# Patient Record
Sex: Male | Born: 1970 | Race: Black or African American | Hispanic: No | Marital: Single | Smoking: Former smoker
Health system: Southern US, Community
[De-identification: ages and names within clinical notes are randomized; demographics above are authoritative.]

## PROBLEM LIST (undated history)

## (undated) DIAGNOSIS — G459 Transient cerebral ischemic attack, unspecified: Secondary | ICD-10-CM

---

## 2021-04-30 ENCOUNTER — Observation Stay (HOSPITAL_COMMUNITY)
Admission: EM | Admit: 2021-04-30 | Discharge: 2021-04-30 | Disposition: A | Discharge status: Home / Self Care | Payer: Commercial Managed Care - PPO | Attending: Internal Medicine | Admitting: Internal Medicine

## 2021-04-30 ENCOUNTER — Emergency Department (HOSPITAL_COMMUNITY): Payer: Commercial Managed Care - PPO

## 2021-04-30 ENCOUNTER — Observation Stay (HOSPITAL_BASED_OUTPATIENT_CLINIC_OR_DEPARTMENT_OTHER): Payer: Commercial Managed Care - PPO

## 2021-04-30 ENCOUNTER — Observation Stay (HOSPITAL_COMMUNITY): Payer: Commercial Managed Care - PPO

## 2021-04-30 ENCOUNTER — Other Ambulatory Visit: Payer: Self-pay

## 2021-04-30 ENCOUNTER — Encounter (HOSPITAL_COMMUNITY): Payer: Self-pay

## 2021-04-30 DIAGNOSIS — Z87891 Personal history of nicotine dependence: Secondary | ICD-10-CM | POA: Diagnosis not present

## 2021-04-30 DIAGNOSIS — G459 Transient cerebral ischemic attack, unspecified: Secondary | ICD-10-CM

## 2021-04-30 DIAGNOSIS — Z20822 Contact with and (suspected) exposure to covid-19: Secondary | ICD-10-CM | POA: Insufficient documentation

## 2021-04-30 DIAGNOSIS — R42 Dizziness and giddiness: Secondary | ICD-10-CM | POA: Insufficient documentation

## 2021-04-30 DIAGNOSIS — Y900 Blood alcohol level of less than 20 mg/100 ml: Secondary | ICD-10-CM | POA: Insufficient documentation

## 2021-04-30 DIAGNOSIS — R200 Anesthesia of skin: Secondary | ICD-10-CM | POA: Diagnosis present

## 2021-04-30 LAB — DIFFERENTIAL
Abs Immature Granulocytes: 0.01 10*3/uL (ref 0.00–0.07)
Basophils Absolute: 0 10*3/uL (ref 0.0–0.1)
Basophils Relative: 0 %
Eosinophils Absolute: 0.1 10*3/uL (ref 0.0–0.5)
Eosinophils Relative: 2 %
Immature Granulocytes: 0 %
Lymphocytes Relative: 56 %
Lymphs Abs: 3.2 10*3/uL (ref 0.7–4.0)
Monocytes Absolute: 0.4 10*3/uL (ref 0.1–1.0)
Monocytes Relative: 6 %
Neutro Abs: 2.1 10*3/uL (ref 1.7–7.7)
Neutrophils Relative %: 36 %

## 2021-04-30 LAB — CBC
HCT: 42.7 % (ref 39.0–52.0)
Hemoglobin: 14.4 g/dL (ref 13.0–17.0)
MCH: 32.3 pg (ref 26.0–34.0)
MCHC: 33.7 g/dL (ref 30.0–36.0)
MCV: 95.7 fL (ref 80.0–100.0)
Platelets: 214 10*3/uL (ref 150–400)
RBC: 4.46 MIL/uL (ref 4.22–5.81)
RDW: 14 % (ref 11.5–15.5)
WBC: 5.8 10*3/uL (ref 4.0–10.5)
nRBC: 0 % (ref 0.0–0.2)

## 2021-04-30 LAB — LIPID PANEL
Cholesterol: 311 mg/dL — ABNORMAL HIGH (ref 0–200)
HDL: 74 mg/dL (ref >40)
LDL Cholesterol: 204 mg/dL — ABNORMAL HIGH (ref 0–99)
Total CHOL/HDL Ratio: 4.2 RATIO
Triglycerides: 164 mg/dL — ABNORMAL HIGH (ref <150)
VLDL: 33 mg/dL (ref 0–40)

## 2021-04-30 LAB — ECHOCARDIOGRAM COMPLETE
Area-P 1/2: 3.72 cm2
Height: 71 in
S' Lateral: 2.8 cm
Weight: 3840 oz

## 2021-04-30 LAB — URINALYSIS, ROUTINE W REFLEX MICROSCOPIC
Bilirubin Urine: NEGATIVE
Glucose, UA: NEGATIVE mg/dL
Hgb urine dipstick: NEGATIVE
Ketones, ur: NEGATIVE mg/dL
Leukocytes,Ua: NEGATIVE
Nitrite: NEGATIVE
Protein, ur: NEGATIVE mg/dL
Specific Gravity, Urine: 1.012 (ref 1.005–1.030)
pH: 7 (ref 5.0–8.0)

## 2021-04-30 LAB — CBG MONITORING, ED: Glucose-Capillary: 100 mg/dL — ABNORMAL HIGH (ref 70–99)

## 2021-04-30 LAB — COMPREHENSIVE METABOLIC PANEL
ALT: 58 U/L — ABNORMAL HIGH (ref 0–44)
AST: 90 U/L — ABNORMAL HIGH (ref 15–41)
Albumin: 4.9 g/dL (ref 3.5–5.0)
Alkaline Phosphatase: 63 U/L (ref 38–126)
Anion gap: 12 (ref 5–15)
BUN: 14 mg/dL (ref 6–20)
CO2: 28 mmol/L (ref 22–32)
Calcium: 9.7 mg/dL (ref 8.9–10.3)
Chloride: 101 mmol/L (ref 98–111)
Creatinine, Ser: 1.51 mg/dL — ABNORMAL HIGH (ref 0.61–1.24)
GFR, Estimated: 57 mL/min — ABNORMAL LOW (ref >60)
Glucose, Bld: 105 mg/dL — ABNORMAL HIGH (ref 70–99)
Potassium: 3.9 mmol/L (ref 3.5–5.1)
Sodium: 141 mmol/L (ref 135–145)
Total Bilirubin: 0.7 mg/dL (ref 0.3–1.2)
Total Protein: 8.1 g/dL (ref 6.5–8.1)

## 2021-04-30 LAB — APTT: aPTT: 31 seconds (ref 24–36)

## 2021-04-30 LAB — HEMOGLOBIN A1C
Hgb A1c MFr Bld: 5.4 % (ref 4.8–5.6)
Mean Plasma Glucose: 108.28 mg/dL

## 2021-04-30 LAB — RESP PANEL BY RT-PCR (FLU A&B, COVID) ARPGX2
Influenza A by PCR: NEGATIVE
Influenza B by PCR: NEGATIVE
SARS Coronavirus 2 by RT PCR: NEGATIVE

## 2021-04-30 LAB — RAPID URINE DRUG SCREEN, HOSP PERFORMED
Amphetamines: NOT DETECTED
Barbiturates: NOT DETECTED
Benzodiazepines: NOT DETECTED
Cocaine: NOT DETECTED
Opiates: NOT DETECTED
Tetrahydrocannabinol: NOT DETECTED

## 2021-04-30 LAB — PROTIME-INR
INR: 0.9 (ref 0.8–1.2)
Prothrombin Time: 12.4 seconds (ref 11.4–15.2)

## 2021-04-30 LAB — ETHANOL: Alcohol, Ethyl (B): <10 mg/dL (ref <10)

## 2021-04-30 MED ORDER — ASPIRIN 325 MG PO TABS
325.0000 mg | ORAL_TABLET | Freq: Every day | ORAL | Status: DC
  Administered 2021-04-30: 325 mg via ORAL
  Filled 2021-04-30: qty 1

## 2021-04-30 MED ORDER — IOHEXOL 350 MG/ML SOLN
100.0000 mL | Freq: Once | INTRAVENOUS | Status: AC | PRN
  Administered 2021-04-30: 100 mL via INTRAVENOUS

## 2021-04-30 MED ORDER — ATORVASTATIN CALCIUM 40 MG PO TABS: 40.0000 mg | ORAL_TABLET | Freq: Every day | ORAL | 3 refills | Status: DC

## 2021-04-30 MED ORDER — STROKE: EARLY STAGES OF RECOVERY BOOK
Freq: Once | Status: DC
  Filled 2021-04-30: qty 1

## 2021-04-30 MED ORDER — ASPIRIN EC 81 MG PO TBEC: 81.0000 mg | DELAYED_RELEASE_TABLET | Freq: Every day | ORAL | 11 refills | Status: AC

## 2021-04-30 MED ORDER — ASPIRIN 300 MG RE SUPP: 300.0000 mg | Freq: Every day | RECTAL | Status: DC

## 2021-04-30 NOTE — ED Triage Notes (Signed)
Pt reports nasal congestion, generalized weakness and dizziness for around a week. 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 Pt sts sudden left sided numbness and like he is staggering since waking up at 0330.

## 2021-04-30 NOTE — ED Notes (Signed)
Speech therapy screened pt. Pt now eating lunch.

## 2021-04-30 NOTE — ED Notes (Signed)
OT at the bedside

## 2021-04-30 NOTE — ED Notes (Signed)
Urinal at bedside.  

## 2021-04-30 NOTE — H&P (Signed)
History and Physical    Andrew Hays XNA:[id number] DOB: 09/09/1971 DOA: 04/30/2021  PCP: Pcp, No  Patient coming from: Home.  I have personally briefly reviewed patient's old medical records available.   Chief Complaint: Left arm and left leg numbness.  HPI: Andrew Hays is a 50 y.o. male with no significant medical history woke up at 3 AM with numb left arm and left leg on the posterior aspect.  Went to bed without symptoms at about 11 PM at night.  He thinks he slept wrong.  The symptoms have scared him so he drove himself to the hospital.  Patient was suffering from allergies and upper respiratory symptoms for last week.  He thinks he does usually get allergy symptoms after he suffered from COVID-19 last year. He denies any headache, nausea, vomiting.  Denies any visual changes.  Denies any auditory changes.  No facial droop.  No slurred speech.  Was able to drive to the ER.  ED Course: Hemodynamically stable.  No neurological deficits.  Stat head CT scan with a stroke protocol was normal.  Subsequent MRI is without any evidence of a stroke. Seen by teleneurology who recommended stroke work-up. On my evaluation, patient is asymptomatic and hungry.  Review of Systems: all systems are reviewed and pertinent positive as per HPI otherwise rest are negative.    History reviewed. No pertinent past medical history.  History reviewed. No pertinent surgical history.  Social history   reports that he quit smoking about 16 months ago. He has never used smokeless tobacco. He reports previous alcohol use. He reports previous drug use.  Allergies  Allergen Reactions  . Other Nausea And Vomiting    All seafood except tuna causes nausea and vomiting    History reviewed. No pertinent family history. Denies any family history of stroke or cardiac disease.  Prior to Admission medications   Not on File    Physical Exam: Vitals:   04/30/21 0615 04/30/21 0705 04/30/21 0738 04/30/21  1007  BP: (!) 135/93 (!) 132/94 131/80 (!) 136/97  Pulse: (!) 54 (!) 55 62 (!) 57  Resp: 18 18 16 17   Temp:   97.7 F (36.5 C) 98.3 F (36.8 C)  TempSrc:   Oral Oral  SpO2: 97% 98% 98% 97%  Weight:      Height:        Constitutional: NAD, calm, comfortable Vitals:   04/30/21 0615 04/30/21 0705 04/30/21 0738 04/30/21 1007  BP: (!) 135/93 (!) 132/94 131/80 (!) 136/97  Pulse: (!) 54 (!) 55 62 (!) 57  Resp: 18 18 16 17   Temp:   97.7 F (36.5 C) 98.3 F (36.8 C)  TempSrc:   Oral Oral  SpO2: 97% 98% 98% 97%  Weight:      Height:       Eyes: PERRL, lids and conjunctivae normal ENMT: Mucous membranes are moist. Posterior pharynx clear of any exudate or lesions.Normal dentition.  Neck: normal, supple, no masses, no thyromegaly Respiratory: clear to auscultation bilaterally, no wheezing, no crackles. Normal respiratory effort. No accessory muscle use.  Cardiovascular: Regular rate and rhythm, no murmurs / rubs / gallops. No extremity edema. 2+ pedal pulses. No carotid bruits.  Abdomen: no tenderness, no masses palpated. No hepatosplenomegaly. Bowel sounds positive.  Musculoskeletal: no clubbing / cyanosis. No joint deformity upper and lower extremities. Good ROM, no contractures. Normal muscle tone.  Skin: no rashes, lesions, ulcers. No induration Neurologic: CN 2-12 grossly intact. Sensation intact, DTR normal. Strength 5/5 in all  4.  Psychiatric: Normal judgment and insight. Alert and oriented x 3. Normal mood.     Labs on Admission: I have personally reviewed following labs and imaging studies  CBC: Recent Labs  Lab 04/30/21 0530  WBC 5.8  NEUTROABS 2.1  HGB 14.4  HCT 42.7  MCV 95.7  PLT 214   Basic Metabolic Panel: Recent Labs  Lab 04/30/21 0530  NA 141  K 3.9  CL 101  CO2 28  GLUCOSE 105*  BUN 14  CREATININE 1.51*  CALCIUM 9.7   GFR: Estimated Creatinine Clearance: 75.9 mL/min (A) (by C-G formula based on SCr of 1.51 mg/dL (H)). Liver Function  Tests: Recent Labs  Lab 04/30/21 0530  AST 90*  ALT 58*  ALKPHOS 63  BILITOT 0.7  PROT 8.1  ALBUMIN 4.9   No results for input(s): LIPASE, AMYLASE in the last 168 hours. No results for input(s): AMMONIA in the last 168 hours. Coagulation Profile: Recent Labs  Lab 04/30/21 0530  INR 0.9   Cardiac Enzymes: No results for input(s): CKTOTAL, CKMB, CKMBINDEX, TROPONINI in the last 168 hours. BNP (last 3 results) No results for input(s): PROBNP in the last 8760 hours. HbA1C: No results for input(s): HGBA1C in the last 72 hours. CBG: Recent Labs  Lab 04/30/21 0541  GLUCAP 100*   Lipid Profile: Recent Labs    04/30/21 0918  CHOL 311*  HDL 74  LDLCALC 204*  TRIG 164*  CHOLHDL 4.2   Thyroid Function Tests: No results for input(s): TSH, T4TOTAL, FREET4, T3FREE, THYROIDAB in the last 72 hours. Anemia Panel: No results for input(s): VITAMINB12, FOLATE, FERRITIN, TIBC, IRON, RETICCTPCT in the last 72 hours. Urine analysis:    Component Value Date/Time   COLORURINE YELLOW 04/30/2021 0742   APPEARANCEUR CLEAR 04/30/2021 0742   LABSPEC 1.012 04/30/2021 0742   PHURINE 7.0 04/30/2021 0742   GLUCOSEU NEGATIVE 04/30/2021 0742   HGBUR NEGATIVE 04/30/2021 0742   BILIRUBINUR NEGATIVE 04/30/2021 0742   KETONESUR NEGATIVE 04/30/2021 0742   PROTEINUR NEGATIVE 04/30/2021 0742   NITRITE NEGATIVE 04/30/2021 0742   LEUKOCYTESUR NEGATIVE 04/30/2021 0742    Radiological Exams on Admission: CT ANGIO HEAD W OR WO CONTRAST  Result Date: 04/30/2021 CLINICAL DATA:  Dizziness with left-sided numbness. EXAM: CT ANGIOGRAPHY HEAD AND NECK TECHNIQUE: Multidetector CT imaging of the head and neck was performed using the standard protocol during bolus administration of intravenous contrast. Multiplanar CT image reconstructions and MIPs were obtained to evaluate the vascular anatomy. Carotid stenosis measurements (when applicable) are obtained utilizing NASCET criteria, using the distal internal  carotid diameter as the denominator. CONTRAST:  OMNIPAQUE IOHEXOL 350 MG/ML SOLN COMPARISON:  MRI in CT same day FINDINGS: CTA NECK FINDINGS Aortic arch: Normal Right carotid system: Normal. No bifurcation disease. No dissection. Left carotid system: Normal. No bifurcation disease. No dissection. Vertebral arteries: Normal Skeleton: Ordinary mild midcervical spondylosis. Other neck: Normal Upper chest: Normal Review of the MIP images confirms the above findings CTA HEAD FINDINGS Anterior circulation: Both internal carotid arteries are patent through the skull base and siphon regions. The anterior and middle cerebral vessels are normal. No large or medium vessel occlusion, stenosis, aneurysm or vascular malformation. Posterior circulation: Both vertebral arteries widely patent to the basilar. No basilar stenosis. Posterior circulation branch vessels are normal. Venous sinuses: Patent and normal. Anatomic variants: None significant. Review of the MIP images confirms the above findings IMPRESSION: Normal examination. No evidence of atherosclerotic vascular disease. No large or medium vessel occlusion. No dissection. Electronically  Signed   By: Paulina FusiMark  Shogry M.D.   On: 04/30/2021 08:41   CT ANGIO NECK W OR WO CONTRAST  Result Date: 04/30/2021 CLINICAL DATA:  Dizziness with left-sided numbness. EXAM: CT ANGIOGRAPHY HEAD AND NECK TECHNIQUE: Multidetector CT imaging of the head and neck was performed using the standard protocol during bolus administration of intravenous contrast. Multiplanar CT image reconstructions and MIPs were obtained to evaluate the vascular anatomy. Carotid stenosis measurements (when applicable) are obtained utilizing NASCET criteria, using the distal internal carotid diameter as the denominator. CONTRAST:  100mL OMNIPAQUE IOHEXOL 350 MG/ML SOLN COMPARISON:  MRI in CT same day FINDINGS: CTA NECK FINDINGS Aortic arch: Normal Right carotid system: Normal. No bifurcation disease. No dissection.  Left carotid system: Normal. No bifurcation disease. No dissection. Vertebral arteries: Normal Skeleton: Ordinary mild midcervical spondylosis. Other neck: Normal Upper chest: Normal Review of the MIP images confirms the above findings CTA HEAD FINDINGS Anterior circulation: Both internal carotid arteries are patent through the skull base and siphon regions. The anterior and middle cerebral vessels are normal. No large or medium vessel occlusion, stenosis, aneurysm or vascular malformation. Posterior circulation: Both vertebral arteries widely patent to the basilar. No basilar stenosis. Posterior circulation branch vessels are normal. Venous sinuses: Patent and normal. Anatomic variants: None significant. Review of the MIP images confirms the above findings IMPRESSION: Normal examination. No evidence of atherosclerotic vascular disease. No large or medium vessel occlusion. No dissection. Electronically Signed   By: Paulina FusiMark  Shogry M.D.   On: 04/30/2021 08:41   MR BRAIN WO CONTRAST  Result Date: 04/30/2021 CLINICAL DATA:  Generalized weakness and dizziness. Left-sided numbness. Gait disturbance. EXAM: MRI HEAD WITHOUT CONTRAST TECHNIQUE: Multiplanar, multiecho pulse sequences of the brain and surrounding structures were obtained without intravenous contrast. COMPARISON:  Head CT 04/30/2021 FINDINGS: Brain: The brain has a normal appearance without evidence of malformation, atrophy, old or acute small or large vessel infarction, mass lesion, hemorrhage, hydrocephalus or extra-axial collection. Vascular: Major vessels at the base of the brain show flow. Venous sinuses appear patent. Skull and upper cervical spine: Normal. Sinuses/Orbits: Rhinosinusitis of the right maxillary sinus with mucosal thickening. Small mastoid effusions, larger on the right than the left. Orbits appear normal. Other: None significant. IMPRESSION: Normal appearance of the brain itself. Inflammatory changes affecting the right maxillary sinus.  Small mastoid effusions, right more than left. Electronically Signed   By: Paulina FusiMark  Shogry M.D.   On: 04/30/2021 07:05   CT HEAD CODE STROKE WO CONTRAST  Result Date: 04/30/2021 CLINICAL DATA:  Code stroke.  Dizziness and left arm numbness EXAM: CT HEAD WITHOUT CONTRAST TECHNIQUE: Contiguous axial images were obtained from the base of the skull through the vertex without intravenous contrast. COMPARISON:  None. FINDINGS: Brain: No evidence of acute infarction, hemorrhage, hydrocephalus, extra-axial collection or mass lesion/mass effect. Vascular: No hyperdense vessel or unexpected calcification. Skull: Normal. Negative for fracture or focal lesion. Sinuses/Orbits: Mucosal thickening in the right maxillary sinus, possibly odontogenic given right upper molar extractions. Other: These results were called by telephone at the time of interpretation on 04/30/2021 at 5:43 am to provider Roxy HorsemanOBERT BROWNING , who verbally acknowledged these results. ASPECTS [facility]([place] Stroke Program Early CT Score) - Ganglionic level infarction (caudate, lentiform nuclei, internal capsule, insula, M1-M3 cortex): 7 - Supraganglionic infarction (M4-M6 cortex): 3 Total score (0-10 with 10 being normal): 10 IMPRESSION: Negative head CT. Electronically Signed   By: Marnee SpringJonathon  Watts M.D.   On: 04/30/2021 05:44    EKG: Independently reviewed.  Normal  sinus rhythm.  Nonspecific T wave changes.  No evidence of acute ischemia.  No acute ST-T wave changes.  Assessment/Plan Principal Problem:   TIA (transient ischemic attack)     1.  TIA/transient peripheral neuropathy: Currently asymptomatic.  Investigate for TIA. Observation and monitoring because of severity of symptoms on presentation. Neurochecks and vital signs as per stroke protocol. Patient was not a TPA and vascular intervention candidate because of minimal or no deficits. Diet, he passed swallow evaluation.  Will start on regular diet. Antiplatelets, none at home.  Started on aspirin  81 mg daily. Statin, LDL 204.  Never treated.  Will discuss about statin on discharge. Blood pressure goals, normotensive Consultations speech, PT OT MRI of the brain, normal. CTA head neck, no evidence of large vessel occlusion or stenosis. 2D echocardiogram, ordered Hemoglobin A1c, pending. COVID-19 negative.   Patient already with improved symptoms, no evidence of residual neurological deficits. MRI brain and CTA of the head and neck are negative. Mobilize, work with PT OT and speech as a stroke protocol. 2D echocardiogram pending. If he remains asymptomatic and negative work-up, will discharge home later today and will refer to neurology for follow-up.  DVT prophylaxis: Lovenox subcu Code Status: Full code Family Communication: Patient's cousin on the phone Disposition Plan: Home Consults called: None, seen by telemetry neurology at night, if positive for stroke will discuss with neurology. Admission status: Observation.   Dorcas Carrow MD [facility] Pager [phone]

## 2021-04-30 NOTE — Consult Note (Signed)
[facility] Tele[facility]urology Consult Services   Date of Service:   04/30/2021 05:27:21  Diagnosis:     .  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  R20.2 - Paresthesia of skin  Impression:     . 50 year old male with a past medical history of presents with symptoms of transient left sided numbness that started when he woke up at 3:30 AM. Recommend admission to rule out stroke.  Metrics: Last Known Well: 04/29/2021 23:00:00 [facility] Notification Time: 04/30/2021 05:27:21 Arrival Time: 04/30/2021 04:42:00 Stamp Time: 04/30/2021 05:27:21 Initial Response Time: 04/30/2021 05:29:13 Symptoms: Left sided numbness. Marland Kitchen NIHSS Start Assessment Time: 04/30/2021 05:33:21 Patient is not a candidate for Thrombolytic. Thrombolytic Medical Decision: 04/30/2021 05:30:52 Patient was not deemed candidate for Thrombolytic because of following reasons: Last Well Known Above 4.5 Hours.  CT head showed no acute hemorrhage or acute core infarct.  ED Physician notified of diagnostic impression and management plan on 04/30/2021 05:35:57  Advanced Imaging: Advanced Imaging Not Recommended because:  Clinical presentation is not suggestion of LVO or Low clinical suspicion of LVO based on presentation   Our recommendations are outlined below.  Recommendations:      .  Stroke/Telemetry Floor     .  Neuro Checks     .  Bedside Swallow Eval     .  DVT Prophylaxis     .  IV Fluids, Normal Saline     .  Head of Bed 30 Degrees     .  Euglycemia and Avoid Hyperthermia (PRN Acetaminophen)     .  Initiate Aspirin 81 MG Daily    Sign Out:     .  Discussed with Emergency Department Provider    ------------------------------------------------------------------------------  History of Present Illness: Patient is a 50 year old Male.  Patient was brought by private transportation with symptoms of Left sided numbness. .  50 year old male with a past medical  history of presents with symptoms of transient left sided numbness that started when he woke up at 3:30 AM. Last known well time was 11 PM. Patient states that the left sided numbness resolved when he was sitting in the waiting room. Patient states that he thinks that he may have slept wrong. Patient denies radicular pain/paresthesias into left arm and left leg.    Past Medical History:     . Hyperlipidemia     . There is NO history of Hypertension     . There is NO history of Diabetes Mellitus     . There is NO history of Coronary Artery Disease     . There is NO history of Stroke  Anticoagulant use:  No  Antiplatelet use: No  Allergies:  Reviewed     Examination: BP(144/99), Pulse(71), Blood Glucose(100) 1A: Level of Consciousness - Alert; keenly responsive + 0 1B: Ask Month and Age - Both Questions Right + 0 1C: Blink Eyes & Squeeze Hands - Performs Both Tasks + 0 2: Test Horizontal Extraocular Movements - Normal + 0 3: Test Visual Fields - No Visual Loss + 0 4: Test Facial Palsy (Use Grimace if Obtunded) - Normal symmetry + 0 5A: Test Left Arm Motor Drift - No Drift for 10 Seconds + 0 5B: Test Right Arm Motor Drift - No Drift for 10 Seconds + 0 6A: Test Left Leg Motor Drift - No Drift for 5 Seconds + 0 6B: Test Right Leg Motor Drift - No Drift for 5 Seconds +  0 7: Test Limb Ataxia (FNF/Heel-Shin) - No Ataxia + 0 8: Test Sensation - Normal; No sensory loss + 0 9: Test Language/Aphasia - Normal; No aphasia + 0 10: Test Dysarthria - Normal + 0 11: Test Extinction/Inattention - No abnormality + 0  NIHSS Score: 0   Pre-Morbid Modified Rankin Scale: 0 Points = No symptoms at all   Patient/Family was informed the Neurology Consult would occur via TeleHealth consult by way of interactive audio and video telecommunications and consented to receiving care in this manner.   Patient is being evaluated for possible acute neurologic impairment and high probability of imminent or  life-threatening deterioration. I spent total of 30 minutes providing care to this patient, including time for face to face visit via telemedicine, review of medical records, imaging studies and discussion of findings with providers, the patient and/or family.   Dr Lucious Groves   [facility] [phone]  Case [id number]

## 2021-04-30 NOTE — ED Notes (Signed)
Patient transported to MRI 

## 2021-04-30 NOTE — Evaluation (Signed)
Occupational Therapy Evaluation Patient Details Name: Andrew Hays MRN: [id number] DOB: [birth year] Today's Date: 04/30/2021    History of Present Illness Andrew Hays is a 50 y.o. male with no significant medical history woke up at 3 AM with numb left arm and left leg on the posterior aspect.   Clinical Impression   Andrew Hays is a 50 year old man who presents with normal ROM, strength, and coordination of upper extremities. He demonstrates normal ability to perform bed transfers, ambulate and the physical abilities to perform ADLs. No reports of visual deficits and balance normal. Patient reports sensory abnormalities had resolved. Patient at baseline. No OT needs at this time.    Follow Up Recommendations  No OT follow up    Equipment Recommendations  None recommended by OT    Recommendations for Other Services       Precautions / Restrictions Precautions Precautions: None      Mobility Bed Mobility Overal bed mobility: Independent                  Transfers Overall transfer level: Independent                    Balance Overall balance assessment: Independent                                         ADL either performed or assessed with clinical judgement   ADL Overall ADL's : Independent                                             Vision Patient Visual Report: No change from baseline       Perception     Praxis      Pertinent Vitals/Pain Pain Assessment: No/denies pain     Hand Dominance     Extremity/Trunk Assessment Upper Extremity Assessment Upper Extremity Assessment: RUE deficits/detail;LUE deficits/detail RUE Deficits / Details: WNL ROm and 5/5 strength RUE Sensation: WNL RUE Coordination: WNL LUE Deficits / Details: WNL ROm and 5/5 strength LUE Sensation: WNL LUE Coordination: WNL   Lower Extremity Assessment Lower Extremity Assessment: Overall WFL for tasks assessed    Cervical / Trunk Assessment Cervical / Trunk Assessment: Normal   Communication     Cognition Arousal/Alertness: Awake/alert Behavior During Therapy: WFL for tasks assessed/performed Overall Cognitive Status: Within Functional Limits for tasks assessed                                     General Comments       Exercises     Shoulder Instructions      Home Living Family/patient expects to be discharged to:: Private residence Living Arrangements: Alone                                      Prior Functioning/Environment                   OT Problem List:        OT Treatment/Interventions:      OT Goals(Current goals can be found in the care plan section) Acute  Rehab OT Goals OT Goal Formulation: All assessment and education complete, DC therapy  OT Frequency:     Barriers to D/C:            Co-evaluation              AM-PAC OT "6 Clicks" Daily Activity     Outcome Measure Help from another person eating meals?: None Help from another person taking care of personal grooming?: None Help from another person toileting, which includes using toliet, bedpan, or urinal?: None Help from another person bathing (including washing, rinsing, drying)?: None Help from another person to put on and taking off regular upper body clothing?: None Help from another person to put on and taking off regular lower body clothing?: None 6 Click Score: 24   End of Session Nurse Communication: Mobility status  Activity Tolerance: Patient tolerated treatment well Patient left: in bed  OT Visit Diagnosis: Other symptoms and signs involving the nervous system (R29.898)                Time: 0912-0929 OT Time Calculation (min): 17 min Charges:  OT General Charges $OT Visit: 1 Visit OT Evaluation $OT Eval Low Complexity: 1 Low  Andrew Hays, OTR/L Acute Care Rehab Services  Office [phone] Pager: [phone]   Andrew Hays 04/30/2021, 12:59  PM

## 2021-04-30 NOTE — Progress Notes (Signed)
  Echocardiogram 2D Echocardiogram has been performed.  Augustine Radar 04/30/2021, 12:21 PM

## 2021-04-30 NOTE — ED Provider Notes (Signed)
[facility]-EMERGENCY DEPT Provider Note   CSN: [id number] Arrival date & time: 04/30/21  0442     History Chief Complaint  Patient presents with  . Dizziness    Andrew Hays is a 50 y.o. male.  Patient presents to the emergency department with a chief complaint of left arm and left leg numbness.  He states that he woke with the symptoms at 330 this morning.  He reports being normal last night when he went to bed at 11 PM.  He states that he has had some congestion and dizziness for the past week.  Denies any weakness, slurred speech, vision changes.  Denies any other associated symptoms.  The history is provided by the patient. No language interpreter was used.       History reviewed. No pertinent past medical history.  There are no problems to display for this patient.   History reviewed. No pertinent surgical history.     No family history on file.  Social History   Tobacco Use  . Smoking status: Former Smoker    Quit date: 2021    Years since quitting: 1.3  . Smokeless tobacco: Never Used  Substance Use Topics  . Alcohol use: Not Currently  . Drug use: Not Currently    Home Medications Prior to Admission medications   Not on File    Allergies    Other  Review of Systems   Review of Systems  All other systems reviewed and are negative.   Physical Exam Updated Vital Signs BP (!) 144/99   Pulse 71   Temp 98.3 F (36.8 C)   Resp 18   Ht 5\' 11"  (1.803 m)   Wt 108.9 kg   SpO2 99%   BMI 33.47 kg/m   Physical Exam Vitals and nursing note reviewed.  Constitutional:      Appearance: He is well-developed.  HENT:     Head: Normocephalic and atraumatic.  Eyes:     Conjunctiva/sclera: Conjunctivae normal.  Cardiovascular:     Rate and Rhythm: Normal rate and regular rhythm.     Heart sounds: No murmur heard.   Pulmonary:     Effort: Pulmonary effort is normal. No respiratory distress.     Breath sounds: Normal breath  sounds.  Abdominal:     Palpations: Abdomen is soft.     Tenderness: There is no abdominal tenderness.  Musculoskeletal:        General: Normal range of motion.     Cervical back: Neck supple.  Skin:    General: Skin is warm and dry.  Neurological:     Mental Status: He is alert and oriented to person, place, and time.     Comments: Left-sided subjective numbness in upper and lower extremities, no weakness Softening of the right nasolabial fold with mild facial droop Otherwise, cranial nerves III through XII are intact No pronator drift Normal finger-nose Normal gait  Psychiatric:        Mood and Affect: Mood normal.        Behavior: Behavior normal.     ED Results / Procedures / Treatments   Labs (all labs ordered are listed, but only abnormal results are displayed) Labs Reviewed  RESP PANEL BY RT-PCR (FLU A&B, COVID) ARPGX2  ETHANOL  PROTIME-INR  APTT  CBC  DIFFERENTIAL  COMPREHENSIVE METABOLIC PANEL  RAPID URINE DRUG SCREEN, HOSP PERFORMED  URINALYSIS, ROUTINE W REFLEX MICROSCOPIC  I-STAT CHEM 8, ED    EKG None  Radiology No  results found.  Procedures Procedures   Medications Ordered in ED Medications - No data to display  ED Course  I have reviewed the triage vital signs and the nursing notes.  Pertinent labs & imaging results that were available during my care of the patient were reviewed by me and considered in my medical decision making (see chart for details).    MDM Rules/Calculators/A&P                          I was called to triage to assess patient for new onset left-sided upper and lower extremity numbness.  Patient woke up with the symptoms.  He woke up at around 330.  He went to bed at 11 PM.  11 PM was his last known well.  I immediately asked for Dr. Manus Gunning to come and evaluate the patient, who states to activate code stroke.  Telemetry neurologist cart was brought to the room, and patient was interviewed by the telemetry  neurologist.  Teleneurologist recommendation is to admit for TIA and rule out stroke with MRI.  Appreciate Dr. Rachael Darby for admitting.  Final Clinical Impression(s) / ED Diagnoses Final diagnoses:  TIA (transient ischemic attack)    Rx / DC Orders ED Discharge Orders    None       Roxy Horseman, PA-C 04/30/21 [id number]    Glynn Octave, MD 04/30/21 2036

## 2021-04-30 NOTE — ED Notes (Signed)
Pt cleared for d/c by hospitalist at this time.

## 2021-04-30 NOTE — ED Notes (Signed)
Neuro at the bedside.

## 2021-04-30 NOTE — Discharge Summary (Signed)
I reviewed patient.  Examined and went through chest and examinations.  Since initial tingling, he has no recurrence of any neurological symptoms.  He is walking around.  Eating full meal.  He has completed stroke work-up and most of the examination findings are negative.  Discharging home, will send a referral to neurology for outpatient follow-up.  Plan: Clinical findings, very mild left-sided tingling.  No recurrence and improved. CT head findings, normal.  Sinusitis. MRI of the brain, no evidence of infarction. CTA of the head and neck, no large vessel occlusion.  No stenosis. 2D echocardiogram, no evidence of blood clot.  Ejection fraction 55%.  Grade 1 diastolic dysfunction. LDL, 204 Hemoglobin A1c, less than 6 Therapy recommendations, none Smoking cessation discussed.  Patient able to go home. Will prescribe aspirin 81 mg daily Lipitor 40 mg daily Patient will report for any recurrence of symptoms, he will quit his smoking. Ambulatory referral to neurology to be seen in 4 to 6 weeks. Patient will establish a primary care physician that his cousin goes to.

## 2021-04-30 NOTE — ED Notes (Signed)
Pt A&O x4. Attached to cardiac monitor x3. VSS. NAD. Pt denies any complaints or concerns at this time.

## 2021-04-30 NOTE — Progress Notes (Signed)
PT Cancellation Note  Patient Details Name: Andrew Hays MRN: [id number] DOB: 09-01-1971   Cancelled Treatment:    Reason Eval/Treat Not Completed: PT screened, no needs identified, will sign off, OT evaluated patient and indicates no deficits/no PT indicated.   Rada Hay 04/30/2021, 10:46 AM  Blanchard Kelch PT Acute Rehabilitation Services Pager [phone] Office [phone]

## 2021-04-30 NOTE — Progress Notes (Signed)
SLP Cancellation Note  Patient Details Name: Andrew Hays MRN: [id number] DOB: [birth year]   Cancelled treatment:       Reason Eval/Treat Not Completed: SLP screened, no needs identified, will sign off. Please reconsult if needs arise.   Jodeci Rini B. Murvin Natal, [facility], CCC-SLP Speech Language Pathologist Office: [phone] Pager: [phone]  Leigh Aurora 04/30/2021, 1:02 PM

## 2021-04-30 NOTE — ED Notes (Signed)
PA, and MD present in room assessing pt.

## 2021-07-24 ENCOUNTER — Emergency Department (HOSPITAL_COMMUNITY)
Admission: EM | Admit: 2021-07-24 | Discharge: 2021-07-25 | Disposition: A | Discharge status: Home / Self Care | Payer: Commercial Managed Care - PPO | Attending: Emergency Medicine | Admitting: Emergency Medicine

## 2021-07-24 ENCOUNTER — Encounter (HOSPITAL_COMMUNITY): Payer: Self-pay

## 2021-07-24 ENCOUNTER — Other Ambulatory Visit: Payer: Self-pay

## 2021-07-24 DIAGNOSIS — R202 Paresthesia of skin: Secondary | ICD-10-CM | POA: Insufficient documentation

## 2021-07-24 DIAGNOSIS — Z8673 Personal history of transient ischemic attack (TIA), and cerebral infarction without residual deficits: Secondary | ICD-10-CM | POA: Insufficient documentation

## 2021-07-24 DIAGNOSIS — Z5321 Procedure and treatment not carried out due to patient leaving prior to being seen by health care provider: Secondary | ICD-10-CM | POA: Insufficient documentation

## 2021-07-24 DIAGNOSIS — Z7982 Long term (current) use of aspirin: Secondary | ICD-10-CM | POA: Insufficient documentation

## 2021-07-24 DIAGNOSIS — I10 Essential (primary) hypertension: Secondary | ICD-10-CM | POA: Insufficient documentation

## 2021-07-24 LAB — COMPREHENSIVE METABOLIC PANEL
ALT: 25 U/L (ref 0–44)
AST: 28 U/L (ref 15–41)
Albumin: 4.6 g/dL (ref 3.5–5.0)
Alkaline Phosphatase: 63 U/L (ref 38–126)
Anion gap: 8 (ref 5–15)
BUN: 18 mg/dL (ref 6–20)
CO2: 25 mmol/L (ref 22–32)
Calcium: 9.4 mg/dL (ref 8.9–10.3)
Chloride: 105 mmol/L (ref 98–111)
Creatinine, Ser: 1.04 mg/dL (ref 0.61–1.24)
GFR, Estimated: >60 mL/min (ref >60)
Glucose, Bld: 114 mg/dL — ABNORMAL HIGH (ref 70–99)
Potassium: 4 mmol/L (ref 3.5–5.1)
Sodium: 138 mmol/L (ref 135–145)
Total Bilirubin: 0.5 mg/dL (ref 0.3–1.2)
Total Protein: 7.6 g/dL (ref 6.5–8.1)

## 2021-07-24 LAB — CBC WITH DIFFERENTIAL/PLATELET
Abs Immature Granulocytes: 0.01 10*3/uL (ref 0.00–0.07)
Basophils Absolute: 0 10*3/uL (ref 0.0–0.1)
Basophils Relative: 0 %
Eosinophils Absolute: 0.1 10*3/uL (ref 0.0–0.5)
Eosinophils Relative: 2 %
HCT: 39.1 % (ref 39.0–52.0)
Hemoglobin: 13 g/dL (ref 13.0–17.0)
Immature Granulocytes: 0 %
Lymphocytes Relative: 57 %
Lymphs Abs: 2.6 10*3/uL (ref 0.7–4.0)
MCH: 33.5 pg (ref 26.0–34.0)
MCHC: 33.2 g/dL (ref 30.0–36.0)
MCV: 100.8 fL — ABNORMAL HIGH (ref 80.0–100.0)
Monocytes Absolute: 0.4 10*3/uL (ref 0.1–1.0)
Monocytes Relative: 8 %
Neutro Abs: 1.6 10*3/uL — ABNORMAL LOW (ref 1.7–7.7)
Neutrophils Relative %: 33 %
Platelets: 218 10*3/uL (ref 150–400)
RBC: 3.88 MIL/uL — ABNORMAL LOW (ref 4.22–5.81)
RDW: 11.9 % (ref 11.5–15.5)
WBC: 4.7 10*3/uL (ref 4.0–10.5)
nRBC: 0 % (ref 0.0–0.2)

## 2021-07-24 LAB — MAGNESIUM: Magnesium: 1.9 mg/dL (ref 1.7–2.4)

## 2021-07-24 LAB — PHOSPHORUS: Phosphorus: 2.9 mg/dL (ref 2.5–4.6)

## 2021-07-24 NOTE — ED Triage Notes (Signed)
Pt reports intermittent numbness and hypertension to left side ever since last hospitalization for TIA. Pt reports taking daily baby aspirin, but no BP medication.

## 2021-07-24 NOTE — ED Provider Notes (Signed)
Emergency Medicine Provider Triage Evaluation Note  Andrew Hays , a 50 y.o. male  was evaluated in triage.  Pt complains of tingling sensation.  Review of Systems  Positive: Intermittent tingling sensation to L side of face, L arm, L leg Negative: Fever, headache, weakness, rash  Physical Exam  BP (!) 153/91 (BP Location: Left Arm)   Pulse 90   Temp 98 F (36.7 C) (Oral)   Resp 16   SpO2 100%  Gen:   Awake, no distress   Resp:  Normal effort  MSK:   Moves extremities without difficulty  Other:    Medical Decision Making  Medically screening exam initiated at 5:44 PM.  Appropriate orders placed.  Andrew Hays was informed that the remainder of the evaluation will be completed by another provider, this initial triage assessment does not replace that evaluation, and the importance of remaining in the ED until their evaluation is complete.  Patient reports since being evaluated and hospitalized in June of this year for TIA symptoms, he has been on the road as a Naval architect and have not had a chance to follow-up with neurology.  He still report having intermittent episodes of tingling sensation to the left side of his face, left arm, left leg that happen sporadically but never fully resolved prompting this ER visit.  No associated pain.   Fayrene Helper, PA-C 07/24/21 1757    Arby Barrette, MD 07/24/21 [phone]

## 2021-07-25 ENCOUNTER — Encounter (HOSPITAL_BASED_OUTPATIENT_CLINIC_OR_DEPARTMENT_OTHER): Payer: Self-pay

## 2021-07-25 ENCOUNTER — Emergency Department (HOSPITAL_BASED_OUTPATIENT_CLINIC_OR_DEPARTMENT_OTHER)
Admission: EM | Admit: 2021-07-25 | Discharge: 2021-07-25 | Disposition: A | Discharge status: Home / Self Care | Payer: Self-pay | Attending: Emergency Medicine | Admitting: Emergency Medicine

## 2021-07-25 DIAGNOSIS — Z87891 Personal history of nicotine dependence: Secondary | ICD-10-CM | POA: Insufficient documentation

## 2021-07-25 DIAGNOSIS — Z7982 Long term (current) use of aspirin: Secondary | ICD-10-CM | POA: Insufficient documentation

## 2021-07-25 DIAGNOSIS — R202 Paresthesia of skin: Secondary | ICD-10-CM | POA: Insufficient documentation

## 2021-07-25 HISTORY — DX: Transient cerebral ischemic attack, unspecified: G45.9

## 2021-07-25 MED ORDER — ATORVASTATIN CALCIUM 40 MG PO TABS: 40.0000 mg | ORAL_TABLET | Freq: Every day | ORAL | 3 refills | Status: DC

## 2021-07-25 MED ORDER — ATORVASTATIN CALCIUM 40 MG PO TABS: 40.0000 mg | ORAL_TABLET | Freq: Every day | ORAL | 3 refills | Status: AC

## 2021-07-25 NOTE — Discharge Instructions (Addendum)
Your blood pressure is slightly elevated today.  Please have this rechecked as an outpatient.  Your prescription for Lipitor has been sent to [facility].  You are also provided with a written prescription. If you are having any new symptoms especially weakness, vision changes, difficulty speaking or dizziness, please return for reevaluation.

## 2021-07-25 NOTE — ED Provider Notes (Signed)
[facility] EMERGENCY DEPT Provider Note   CSN: [id number] Arrival date & time: 07/25/21  2878     History Chief Complaint  Patient presents with   Numbness    Andrew Hays is a 50 y.o. male.  HPI 50 year old male resents today complaining of left arm and leg numbness.  He states this began back in early June.  He was seen at that time and had a work-up including an MRI, CT, and CTA of his brain.  All these were negative.  He was given a diagnosis of TIA.  However, symptoms have essentially been ongoing since that time.  He has been driving a truck.  He notices this sometimes during the day it appears worse.  At the time of his original CT and MRI there was some maxillary fluid noted.  He denies any fever.  He states that he did note that before and the symptoms of the pressure in his sinuses and feeling there was liquid there have since resolved.  He denies any fever and has had some mild left frontal headache.  He denies any vision changes, difficulty speaking, difficulty swallowing, chest pain, dyspnea, abdominal pain, or gait difficulties.  He does have high lipids and takes a statin.     Past Medical History:  Diagnosis Date   Transient ischemic attack (TIA)     Patient Active Problem List   Diagnosis Date Noted   TIA (transient ischemic attack) 04/30/2021    No past surgical history on file.     No family history on file.  Social History   Tobacco Use   Smoking status: Former    Types: Cigarettes    Quit date: 2021    Years since quitting: 1.5   Smokeless tobacco: Never  Substance Use Topics   Alcohol use: Not Currently   Drug use: Not Currently    Home Medications Prior to Admission medications   Medication Sig Start Date End Date Taking? Authorizing Provider  albuterol (VENTOLIN HFA) 108 (90 Base) MCG/ACT inhaler Inhale 1-2 puffs into the lungs every 6 (six) hours as needed for wheezing or shortness of breath.    [provider]  aspirin EC 81 MG tablet Take 1 tablet (81 mg total) by mouth daily. Swallow whole. 04/30/21 04/30/22  Dorcas Carrow, MD  atorvastatin (LIPITOR) 40 MG tablet Take 1 tablet (40 mg total) by mouth daily. Patient not taking: Reported on 07/24/2021 04/30/21 04/30/22  Dorcas Carrow, MD    Allergies    Other  Review of Systems   Review of Systems  All other systems reviewed and are negative.  Physical Exam Updated Vital Signs BP (!) 137/91 (BP Location: Right Arm)   Pulse 76   Temp 98.6 F (37 C) (Oral)   Resp 16   SpO2 99%   Physical Exam Vitals and nursing note reviewed.  Constitutional:      Appearance: He is well-developed.  HENT:     Head: Normocephalic and atraumatic.     Right Ear: Tympanic membrane and external ear normal.     Left Ear: Tympanic membrane and external ear normal.     Nose: Nose normal.     Right Sinus: No maxillary sinus tenderness or frontal sinus tenderness.     Left Sinus: No maxillary sinus tenderness or frontal sinus tenderness.  Eyes:     Extraocular Movements:     Right eye: No nystagmus.     Left eye: No nystagmus.     Conjunctiva/sclera: Conjunctivae  normal.     Pupils: Pupils are equal, round, and reactive to light.  Cardiovascular:     Rate and Rhythm: Normal rate and regular rhythm.     Heart sounds: Normal heart sounds.  Pulmonary:     Effort: Pulmonary effort is normal. No respiratory distress.     Breath sounds: Normal breath sounds.  Chest:     Chest wall: No tenderness.  Abdominal:     General: Bowel sounds are normal. There is no distension.     Palpations: Abdomen is soft. There is no mass.     Tenderness: There is no abdominal tenderness.  Musculoskeletal:        General: No tenderness. Normal range of motion.     Cervical back: Normal range of motion and neck supple.  Skin:    General: Skin is warm and dry.     Findings: No rash.  Neurological:     Mental Status: He is alert and oriented to person, place, and  time.     GCS: GCS eye subscore is 4. GCS verbal subscore is 5. GCS motor subscore is 6.     Cranial Nerves: No cranial nerve deficit.     Sensory: No sensory deficit.     Motor: No weakness.     Coordination: Coordination normal.     Gait: Gait normal.     Deep Tendon Reflexes: Reflexes are normal and symmetric. Reflexes normal. Babinski sign absent on the right side. Babinski sign absent on the left side.     Reflex Scores:      Brachioradialis reflexes are 2+ on the right side and 2+ on the left side.      Patellar reflexes are 2+ on the right side and 2+ on the left side.      Achilles reflexes are 2+ on the right side and 2+ on the left side.    Comments: Patient with normal gait without ataxia, shuffling, spasm, or antalgia. Speech is normal without dysarthria, dysphasia, or aphasia. Muscle strength is 5/5 in bilateral shoulders, elbow flexor and extensors, wrist flexor and extensors, and intrinsic hand muscles. 5/5 bilateral lower extremity hip flexors, extensors, knee flexors and extensors, and ankle dorsi and plantar flexors.    Psychiatric:        Behavior: Behavior normal.        Thought Content: Thought content normal.        Judgment: Judgment normal.    ED Results / Procedures / Treatments   Labs (all labs ordered are listed, but only abnormal results are displayed) Labs Reviewed - No data to display  EKG None  Radiology No results found.  Procedures Procedures   Medications Ordered in ED Medications - No data to display  ED Course  I have reviewed the triage vital signs and the nursing notes.  Pertinent labs & imaging results that were available during my care of the patient were reviewed by me and considered in my medical decision making (see chart for details).    MDM Rules/Calculators/A&P                           Patient with ongoing paresthesias over the past 8 weeks.  He had a normal CT head, CTA, and MRI.  Doubt that this represents stroke or  TIA.  He has had really no significant change or progression of symptoms.  We have discussed his blood pressure, high lipids, and I will represcribe his  medications.  We discussed return precautions and need for follow-up and he voices understanding. Final Clinical Impression(s) / ED Diagnoses Final diagnoses:  None    Rx / DC Orders ED Discharge Orders     None        Margarita Grizzle, MD 07/25/21 [phone]

## 2021-07-25 NOTE — ED Triage Notes (Signed)
He states he was dx with "TIA" in June of this year. He states that "ever since then, I feel dizzy a lot and my left arm gets numb". He reports nothing new. He is ambulatory and in no distress.

## 2022-10-10 IMAGING — CT CT HEAD CODE STROKE
3 series · 16 of 47 positions shown, 19 images · non-contrast
Comparison: None.

CLINICAL DATA: Code stroke.  Dizziness and left arm numbness

EXAM:
CT HEAD WITHOUT CONTRAST
TECHNIQUE: Contiguous axial images were obtained from the base of the skull
through the vertex without intravenous contrast.

[Series 3: head wo · axial · 0.47mm/px · z∈[-75,+65]mm · 10 of 34 slices shown, 13 images]
[im 3/34  brain]
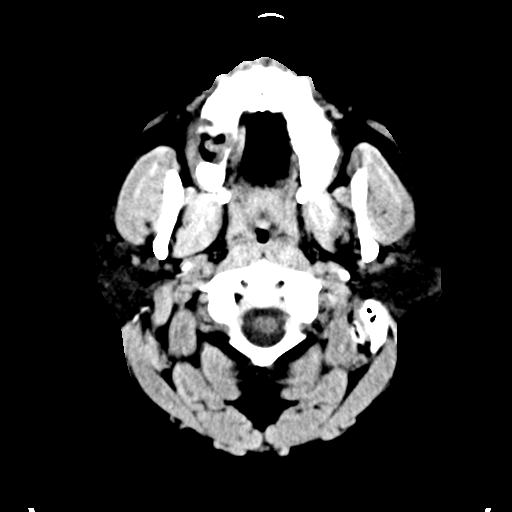
[im 3/34  bone]
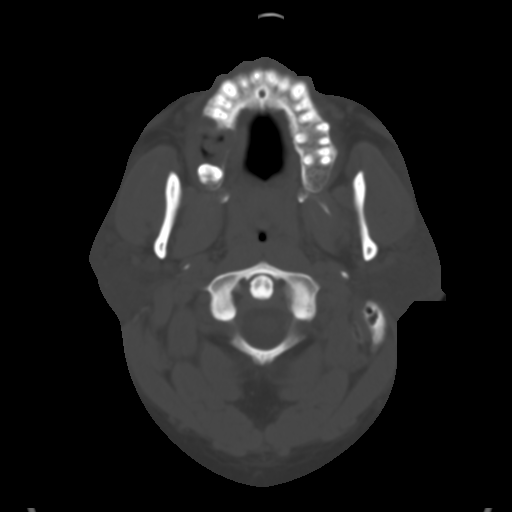
[im 6/34  brain]
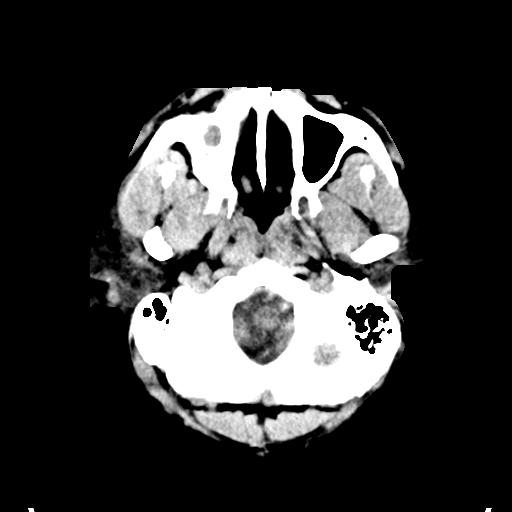
[im 10/34  brain]
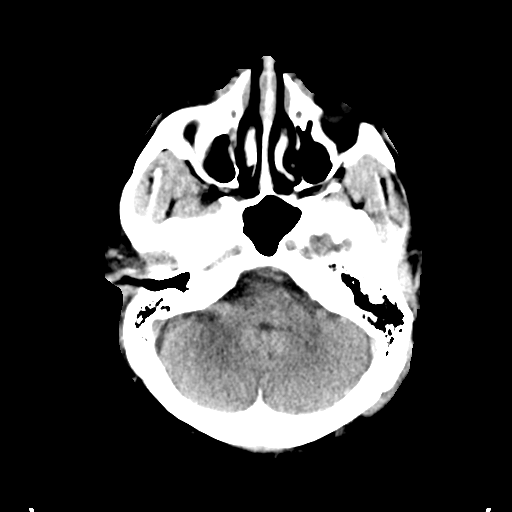
[im 12/34  brain]
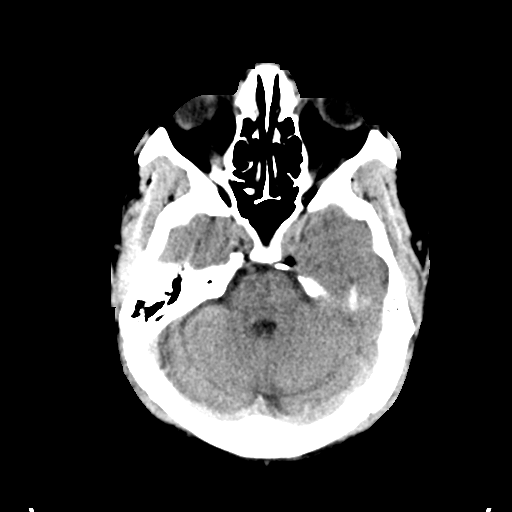
[im 15/34  brain]
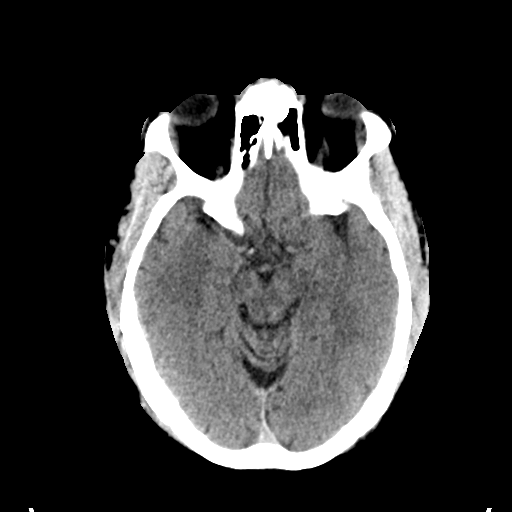
[im 15/34  bone]
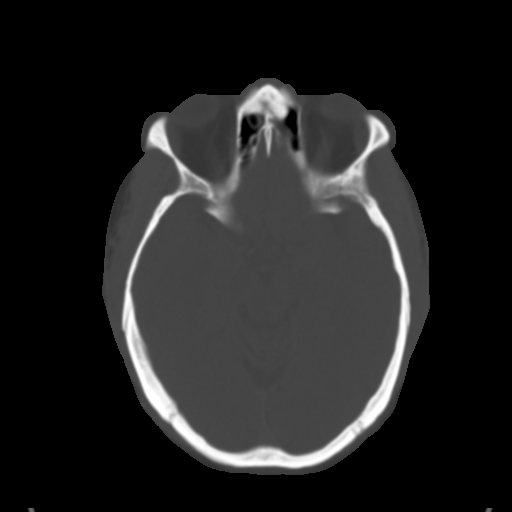
[im 19/34  brain]
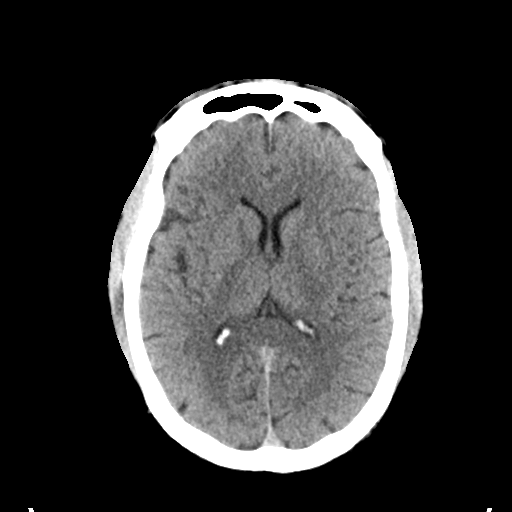
[im 22/34  brain]
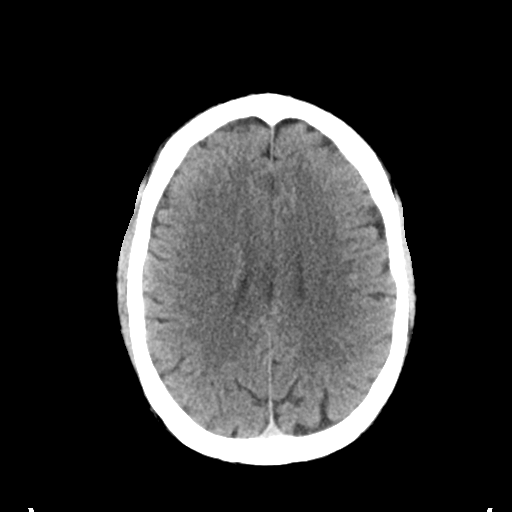
[im 26/34  brain]
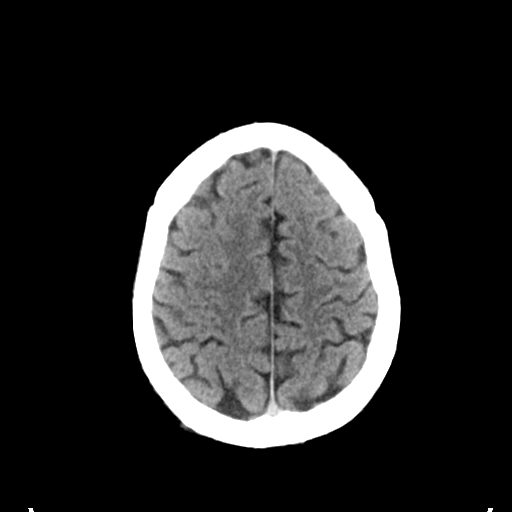
[im 28/34  brain]
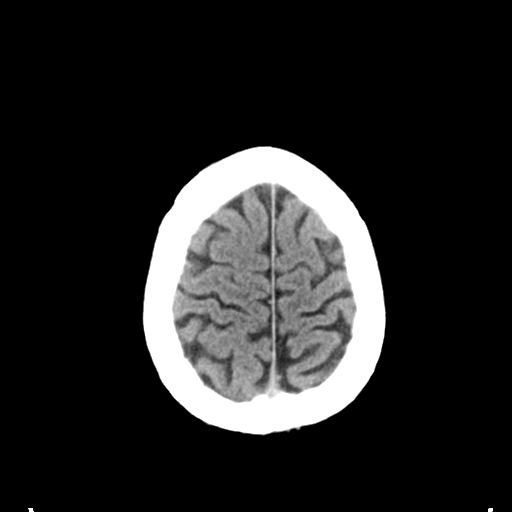
[im 28/34  bone]
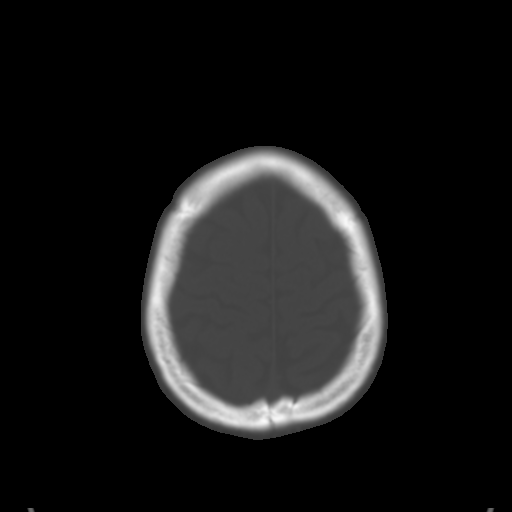
[im 31/34  brain]
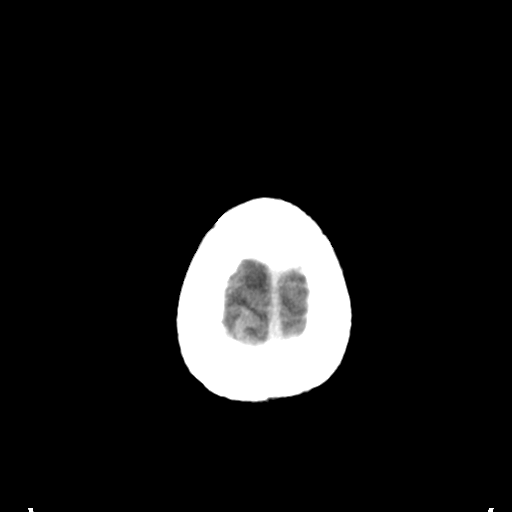

[Series 5: coronal soft tissue · coronal · 0.34mm/px · 3 of 67 slices shown]
[im 23/67  brain]
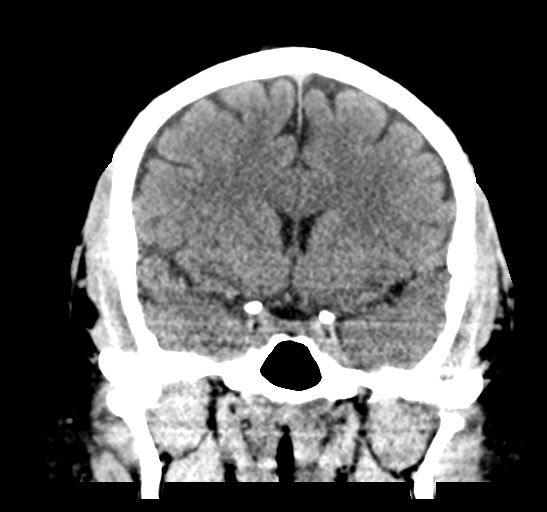
[im 30/67  brain]
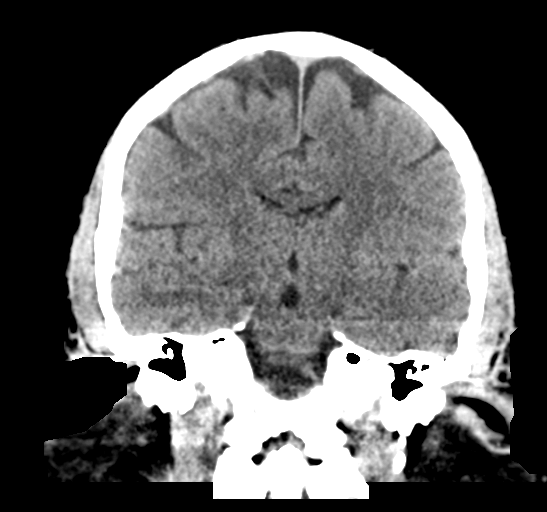
[im 37/67  brain]
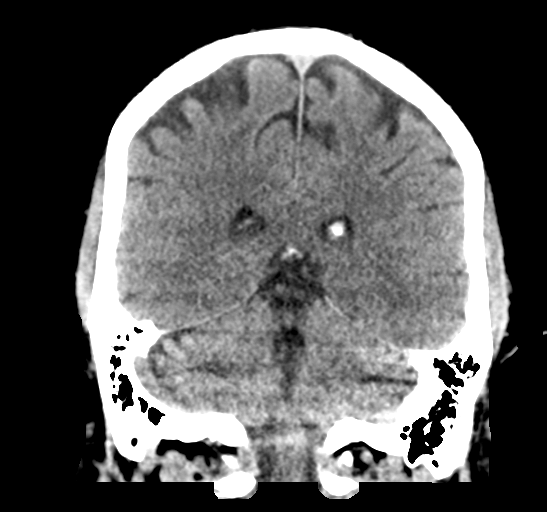

[Series 6: sagittal soft tissue · sagittal · 0.37mm/px · 3 of 49 slices shown]
[im 17/49  brain]
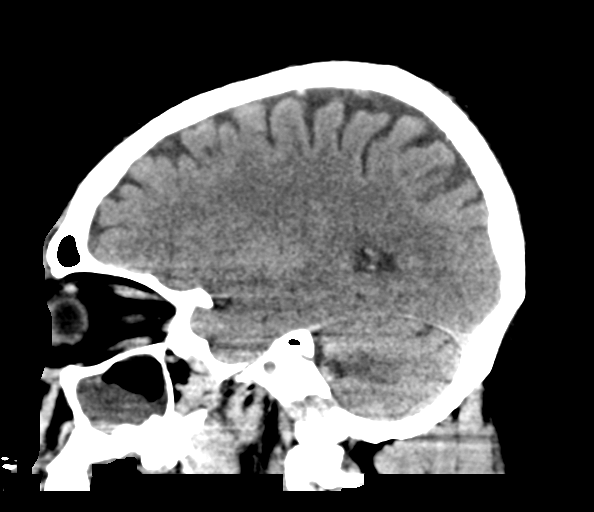
[im 25/49  brain]
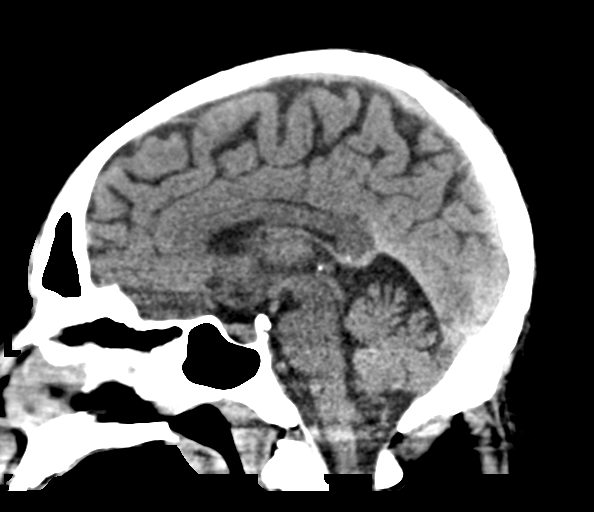
[im 33/49  brain]
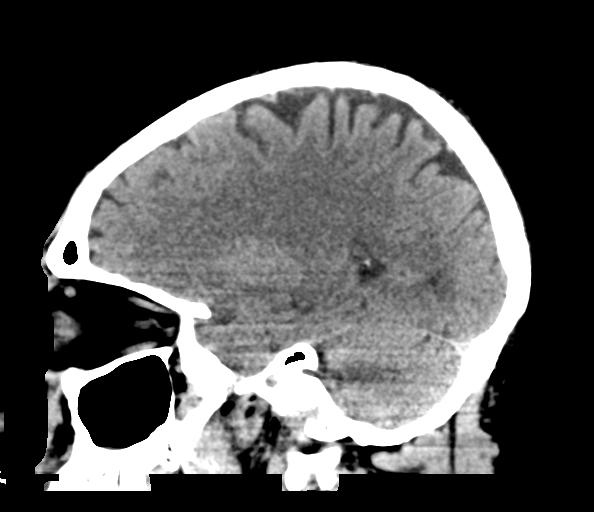

[16 of 47 positions shown; findings below may reference images not displayed]

FINDINGS: Brain: No evidence of acute infarction, hemorrhage, hydrocephalus,
extra-axial collection or mass lesion/mass effect.

Vascular: No hyperdense vessel or unexpected calcification.

Skull: Normal. Negative for fracture or focal lesion.

Sinuses/Orbits: Mucosal thickening in the right maxillary sinus,
possibly odontogenic given right upper molar extractions.

Other: These results were called by telephone at the time of
interpretation on 04/30/2021 at [DATE] to provider NENELYN VIEN ,
who verbally acknowledged these results.

ASPECTS (Alberta Stroke Program Early CT Score)

- Ganglionic level infarction (caudate, lentiform nuclei, internal
capsule, insula, M1-M3 cortex): 7

- Supraganglionic infarction (M4-M6 cortex): 3

Total score (0-10 with 10 being normal): 10
IMPRESSION: Negative head CT.

## 2023-07-09 ENCOUNTER — Emergency Department (HOSPITAL_COMMUNITY): Payer: Medicaid Other

## 2023-07-09 ENCOUNTER — Other Ambulatory Visit: Payer: Self-pay

## 2023-07-09 ENCOUNTER — Emergency Department (HOSPITAL_COMMUNITY)
Admission: EM | Admit: 2023-07-09 | Discharge: 2023-07-09 | Disposition: A | Discharge status: Home / Self Care | Payer: Medicaid Other | Attending: Emergency Medicine | Admitting: Emergency Medicine

## 2023-07-09 ENCOUNTER — Encounter (HOSPITAL_COMMUNITY): Payer: Self-pay | Admitting: *Deleted

## 2023-07-09 DIAGNOSIS — R209 Unspecified disturbances of skin sensation: Secondary | ICD-10-CM | POA: Diagnosis not present

## 2023-07-09 DIAGNOSIS — R0602 Shortness of breath: Secondary | ICD-10-CM | POA: Insufficient documentation

## 2023-07-09 DIAGNOSIS — R002 Palpitations: Secondary | ICD-10-CM | POA: Insufficient documentation

## 2023-07-09 DIAGNOSIS — R42 Dizziness and giddiness: Secondary | ICD-10-CM | POA: Insufficient documentation

## 2023-07-09 DIAGNOSIS — R200 Anesthesia of skin: Secondary | ICD-10-CM

## 2023-07-09 DIAGNOSIS — R079 Chest pain, unspecified: Secondary | ICD-10-CM | POA: Diagnosis not present

## 2023-07-09 LAB — CBC
HCT: 41.1 % (ref 39.0–52.0)
Hemoglobin: 13.8 g/dL (ref 13.0–17.0)
MCH: 32.5 pg (ref 26.0–34.0)
MCHC: 33.6 g/dL (ref 30.0–36.0)
MCV: 96.7 fL (ref 80.0–100.0)
Platelets: 222 10*3/uL (ref 150–400)
RBC: 4.25 MIL/uL (ref 4.22–5.81)
RDW: 12.2 % (ref 11.5–15.5)
WBC: 4.9 10*3/uL (ref 4.0–10.5)
nRBC: 0 % (ref 0.0–0.2)

## 2023-07-09 LAB — BASIC METABOLIC PANEL
Anion gap: 9 (ref 5–15)
BUN: 12 mg/dL (ref 6–20)
CO2: 24 mmol/L (ref 22–32)
Calcium: 9.1 mg/dL (ref 8.9–10.3)
Chloride: 103 mmol/L (ref 98–111)
Creatinine, Ser: 1.27 mg/dL — ABNORMAL HIGH (ref 0.61–1.24)
GFR, Estimated: >60 mL/min (ref >60)
Glucose, Bld: 100 mg/dL — ABNORMAL HIGH (ref 70–99)
Potassium: 3.6 mmol/L (ref 3.5–5.1)
Sodium: 136 mmol/L (ref 135–145)

## 2023-07-09 LAB — TROPONIN I (HIGH SENSITIVITY)
Troponin I (High Sensitivity): 5 ng/L (ref <18)
Troponin I (High Sensitivity): 8 ng/L (ref <18)

## 2023-07-09 NOTE — ED Triage Notes (Signed)
The pt has had numerus symptoms for 1-2 years  today.  His symptoms keep coming back the same way  he has lt facial numbness lt face lt arm and lt leg pain for weeks. One hour ago he started having chest pain a headache  rapid heart rate and some sob

## 2023-07-09 NOTE — ED Provider Notes (Signed)
[place] EMERGENCY DEPARTMENT AT [facility] Provider Note   CSN: [id number] Arrival date & time: 07/09/23  1855     History  Chief Complaint  Patient presents with   Chest Pain    Andrew Hays is a 52 y.o. male.  He has been having ongoing left-sided numbness of his face arm and leg that is been intermittent for a year or 2.  For the last week he has been experiencing it more consistently and today it was going on and he also felt like he was a little short of breath and felt his heart was racing.  He has been worked up by neurology had an MRI and is awaiting a sleep study.  He denies any headache blurry vision vomiting diarrhea.  He does not smoke drink or use any drugs.  He feels his shortness of breath and chest discomfort are improved since he has been here.  The history is provided by the patient.  Chest Pain Pain location:  Substernal area Pain quality: aching   Pain radiates to:  Does not radiate Pain severity:  Mild Onset quality:  Gradual Progression:  Resolved Chronicity:  New Relieved by:  None tried Worsened by:  Nothing Ineffective treatments:  None tried Associated symptoms: dizziness, numbness, palpitations and shortness of breath   Associated symptoms: no cough, no diaphoresis, no fever, no nausea and no vomiting   Risk factors: no coronary artery disease, no diabetes mellitus and no smoking        Home Medications Prior to Admission medications   Medication Sig Start Date End Date Taking? Authorizing Provider  albuterol (VENTOLIN HFA) 108 (90 Base) MCG/ACT inhaler Inhale 1-2 puffs into the lungs every 6 (six) hours as needed for wheezing or shortness of breath.    [provider]  atorvastatin (LIPITOR) 40 MG tablet Take 1 tablet (40 mg total) by mouth daily. 07/25/21 07/25/22  Margarita Grizzle, MD      Allergies    Other    Review of Systems   Review of Systems  Constitutional:  Negative for diaphoresis and fever.  Eyes:   Negative for visual disturbance.  Respiratory:  Positive for shortness of breath. Negative for cough.   Cardiovascular:  Positive for chest pain and palpitations.  Gastrointestinal:  Negative for nausea and vomiting.  Neurological:  Positive for dizziness and numbness.    Physical Exam Updated Vital Signs BP (!) 137/94 (BP Location: Right Arm)   Pulse 73   Temp 98 F (36.7 C) (Oral)   Resp 19   Ht 5\' 11"  (1.803 m)   Wt 108.9 kg   SpO2 98%   BMI 33.48 kg/m  Physical Exam Vitals and nursing note reviewed.  Constitutional:      General: He is not in acute distress.    Appearance: He is well-developed.  HENT:     Head: Normocephalic and atraumatic.  Eyes:     Conjunctiva/sclera: Conjunctivae normal.  Cardiovascular:     Rate and Rhythm: Normal rate and regular rhythm.     Heart sounds: No murmur heard. Pulmonary:     Effort: Pulmonary effort is normal. No respiratory distress.     Breath sounds: Normal breath sounds.  Abdominal:     Palpations: Abdomen is soft.     Tenderness: There is no abdominal tenderness.  Musculoskeletal:        General: No swelling. Normal range of motion.     Cervical back: Neck supple.  Skin:  General: Skin is warm and dry.     Capillary Refill: Capillary refill takes less than 2 seconds.  Neurological:     Mental Status: He is alert and oriented to person, place, and time.     GCS: GCS eye subscore is 4. GCS verbal subscore is 5. GCS motor subscore is 6.     Cranial Nerves: No dysarthria or facial asymmetry.     Sensory: Sensation is intact.     Motor: Motor function is intact.     Coordination: Coordination is intact.     Gait: Gait is intact.  Psychiatric:        Mood and Affect: Mood normal.     ED Results / Procedures / Treatments   Labs (all labs ordered are listed, but only abnormal results are displayed) Labs Reviewed  BASIC METABOLIC PANEL - Abnormal; Notable for the following components:      Result Value   Glucose, Bld  100 (*)    Creatinine, Ser 1.27 (*)    All other components within normal limits  CBC  TROPONIN I (HIGH SENSITIVITY)  TROPONIN I (HIGH SENSITIVITY)    EKG EKG Interpretation Date/Time:  Saturday July 09 2023 19:08:21 EDT Ventricular Rate:  68 PR Interval:  150 QRS Duration:  92 QT Interval:  380 QTC Calculation: 404 R Axis:   -18  Text Interpretation: Sinus rhythm with Premature supraventricular complexes and with occasional Premature ventricular complexes Incomplete right bundle branch block Borderline ECG When compared with ECG of 30-Apr-2021 05:21, No significant change since last tracing Confirmed by Meridee Score [phone]) on 07/09/2023 8:39:05 PM  Radiology DG Chest 2 View  Result Date: 07/09/2023 CLINICAL DATA:  Chest pain. EXAM: CHEST - 2 VIEW COMPARISON:  None Available. FINDINGS: The heart size and mediastinal contours are within normal limits. Both lungs are clear. The visualized skeletal structures are unremarkable. IMPRESSION: No active cardiopulmonary disease. Electronically Signed   By: Ted Mcalpine M.D.   On: 07/09/2023 20:09    Procedures Procedures    Medications Ordered in ED Medications - No data to display  ED Course/ Medical Decision Making/ A&P                             Medical Decision Making  This patient complains of left-sided numbness ongoing for a year or 2, chest pain and palpitations; this involves an extensive number of treatment Options and is a complaint that carries with it a high risk of complications and morbidity. The differential includes ACS, anxiety, vascular, stroke, bleed, metabolic derangement  I ordered, reviewed and interpreted labs, which included CBC normal chemistries normal Mildly elevated creatinine, troponins flat I ordered imaging studies which included chest x-ray and I independently    visualized and interpreted imaging which showed no acute findings Previous records obtained and reviewed in epic including  recent neurology notes documenting he has ongoing numbness in his left face arm and leg.  He has had CAT scans and MRIs and pursuing workup in this. Cardiac monitoring reviewed, normal sinus rhythm Social determinants considered, no significant barriers Critical Interventions: None  After the interventions stated above, I reevaluated the patient and found patient to be well-appearing stable vitals no distress Admission and further testing considered, no indications for further workup or admission at this time.  Recommended close follow-up with his neurology team for further evaluation and seeing his cardiologist regarding his atypical chest pain.  Return instructions discussed  Final Clinical Impression(s) / ED Diagnoses Final diagnoses:  Left sided numbness  Nonspecific chest pain    Rx / DC Orders ED Discharge Orders     None         Terrilee Files, MD 07/10/23 [phone]

## 2023-07-09 NOTE — Discharge Instructions (Signed)
You were seen in the emergency department for continued numbness on your left side along with some chest pain shortness of breath tonight.  You had blood work EKG chest x-ray that did not show any evidence of heart injury.  Please follow-up with your neurology team as scheduled to continue to workup the cause of your numbness.  Return to the emergency department if any worsening or concerning symptoms
